# Patient Record
Sex: Male | Born: 1990 | Race: Black or African American | Hispanic: No | Marital: Single | Smoking: Never smoker
Health system: Southern US, Community
[De-identification: ages and names within clinical notes are randomized; demographics above are authoritative.]

## PROBLEM LIST (undated history)

## (undated) DIAGNOSIS — R569 Unspecified convulsions: Secondary | ICD-10-CM

## (undated) HISTORY — PX: BRAIN SURGERY: SHX531

---

## 2017-09-28 ENCOUNTER — Encounter: Payer: BLUE CROSS/BLUE SHIELD | Attending: Physician Assistant | Admitting: Physician Assistant

## 2017-09-28 ENCOUNTER — Other Ambulatory Visit: Payer: Self-pay | Admitting: Neurology

## 2017-09-28 DIAGNOSIS — G40909 Epilepsy, unspecified, not intractable, without status epilepticus: Secondary | ICD-10-CM | POA: Diagnosis not present

## 2017-09-28 DIAGNOSIS — Z87898 Personal history of other specified conditions: Secondary | ICD-10-CM

## 2017-09-28 DIAGNOSIS — Y838 Other surgical procedures as the cause of abnormal reaction of the patient, or of later complication, without mention of misadventure at the time of the procedure: Secondary | ICD-10-CM | POA: Insufficient documentation

## 2017-09-28 DIAGNOSIS — T8131XA Disruption of external operation (surgical) wound, not elsewhere classified, initial encounter: Secondary | ICD-10-CM | POA: Insufficient documentation

## 2017-10-02 NOTE — Progress Notes (Signed)
[name] ([id number]) Visit Report for 09/28/2017 Abuse/Suicide Risk Screen Details Patient Name: [name], [name] Date of Service: 09/28/2017 12:30 PM Medical Record Number: [id number] Patient Account Number: [id number] Date of Birth/Sex: Dec 01, 1990 (26 y.o. M) Treating RN: [name] Primary Care [name]: PATIENT, NO Other Clinician: Referring [name]: [name] Treating [name]/Extender: [name] Weeks in Treatment: 0 Abuse/Suicide Risk Screen Items Answer ABUSE/SUICIDE RISK SCREEN: Has anyone close to you tried to hurt or harm you recentlyo No Do you feel uncomfortable with anyone in your familyo No Has anyone forced you do things that you didnot want to doo No Do you have any thoughts of harming yourselfo No Patient displays signs or symptoms of abuse and/or neglect. No Electronic Signature(s) Signed: 09/28/2017 3:14:51 PM By: [name] Entered By: [name] on 09/28/2017 12:51:23 [name], [name] ([id number]) -------------------------------------------------------------------------------- Activities of Daily Living Details Patient Name: [name] Date of Service: 09/28/2017 12:30 PM Medical Record Number: [id number] Patient Account Number: [id number] Date of Birth/Sex: [birth year] (26 y.o. M) Treating RN: [name] Primary Care [name]: PATIENT, NO Other Clinician: Referring [name]: [name] Treating [name]/Extender: [name] Weeks in Treatment: 0 Activities of Daily Living Items Answer Activities of Daily Living (Please select one for each item) Drive Automobile Not Able Take Medications Completely Able Use Telephone Completely Able Care for Appearance Completely Able Use Toilet Completely Able Bath / Shower Completely Able Dress Self Completely Able Feed Self Completely Able Walk Completely Able Get In / Out Bed Completely Able Housework Completely Able Prepare Meals Completely Able Handle Money Completely  Able Shop for Self Completely Able Electronic Signature(s) Signed: 09/28/2017 3:14:51 PM By: [name] Entered By: [name] on 09/28/2017 12:52:18 [name] ([id number]) -------------------------------------------------------------------------------- Education Assessment Details Patient Name: [name] Date of Service: 09/28/2017 12:30 PM Medical Record Number: [id number] Patient Account Number: [id number] Date of Birth/Sex: 1990/12/19 (26 y.o. M) Treating RN: [name] Primary Care [name]: PATIENT, NO Other Clinician: Referring [name]: [name] Treating [name] Weeks in Treatment: 0 Primary Learner Assessed: Patient Learning Preferences/Education Level/Primary Language Learning Preference: Explanation, Demonstration Highest Education Level: College or Above Preferred Language: English Cognitive Barrier Assessment/Beliefs Language Barrier: No Translator Needed: No Memory Deficit: No Emotional Barrier: No Cultural/Religious Beliefs Affecting Medical Care: No Physical Barrier Assessment Impaired Vision: No Impaired Hearing: No Decreased Hand dexterity: No Knowledge/Comprehension Assessment Knowledge Level: Medium Comprehension Level: Medium Ability to understand written Medium instructions: Ability to understand verbal Medium instructions: Motivation Assessment Anxiety Level: Calm Cooperation: Cooperative Education Importance: Acknowledges Need Interest in Health Problems: Asks Questions Perception: Coherent Willingness to Engage in Self- Medium Management Activities: Readiness to Engage in Self- Medium Management Activities: Electronic Signature(s) Signed: 09/28/2017 3:14:51 PM By: [name] Entered By: [name] on 09/28/2017 12:52:39 [name], [name] ([id number]) -------------------------------------------------------------------------------- Fall Risk Assessment Details Patient Name: [name] Date of Service: 09/28/2017 12:30 PM Medical Record Number: [id number] Patient Account Number: [id number] Date of Birth/Sex: [birth year] (26 y.o. M) Treating RN: [name] Primary Care Khushboo Chuck: PATIENT, NO Other Clinician: Referring Anyela Napierkowski: [name] Treating [name]/Extender: [name] Weeks in Treatment: 0 Fall Risk Assessment Items Have you had 2 or more falls in the last 12 monthso 0 No Have you had any fall that resulted in injury in the last 12 monthso 0 No FALL RISK ASSESSMENT: History of falling - immediate or within 3 months 0 No Secondary diagnosis 0 No Ambulatory aid None/bed rest/wheelchair/nurse 0 Yes Crutches/cane/walker 0 No Furniture 0 No IV Access/Saline Lock 0 No Gait/Training  Normal/bed rest/immobile 0 Yes Weak 0 No Impaired 0 No Mental Status Oriented to own ability 0 Yes Electronic Signature(s) Signed: 09/28/2017 3:14:51 PM By: Howard Sitesorthy, [name] Entered By: Howard Sitesorthy, [name] on 09/28/2017 12:52:47 [name], [name] ([id number]) -------------------------------------------------------------------------------- Nutrition Risk Assessment Details Patient Name: [name], [name] Date of Service: 09/28/2017 12:30 PM Medical Record Number: [id number] Patient Account Number: [id number] Date of Birth/Sex: 11/24/1990 (26 y.o. M) Treating RN: Howard Sitesorthy, [name] Primary Care Dimetrius Montfort: PATIENT, NO Other Clinician: Referring Latravion Graves: Howard MasterPOTTER, [name] Treating Mitchelle Sultan/Extender: [name] Weeks in Treatment: 0 Height (in): 69 Weight (lbs): 160 Body Mass Index (BMI): 23.6 Nutrition Risk Assessment Items NUTRITION RISK SCREEN: I have an illness or condition that made me change the kind and/or amount of 0 No food I eat I eat fewer than two meals per day 0 No I eat few fruits and vegetables, or milk products 0 No I have three or more drinks of beer, liquor or wine almost every day 0 No I have tooth or mouth problems that make it hard for me to eat 0 No I  don't always have enough money to buy the food I need 0 No I eat alone most of the time 0 No I take three or more different prescribed or over-the-counter drugs a day 0 No Without wanting to, I have lost or gained 10 pounds in the last six months 0 No I am not always physically able to shop, cook and/or feed myself 0 No Nutrition Protocols Good Risk Protocol 0 No interventions needed Moderate Risk Protocol Electronic Signature(s) Signed: 09/28/2017 3:14:51 PM By: Howard Sitesorthy, [name] Entered By: Howard Sitesorthy, [name] on 09/28/2017 12:52:53

## 2017-10-04 ENCOUNTER — Ambulatory Visit: Payer: BLUE CROSS/BLUE SHIELD

## 2017-10-04 NOTE — Progress Notes (Signed)
Howard, Delgado ([id number]) Visit Report for 09/28/2017 Allergy List Details Patient Name: [name], [name] Date of Service: 09/28/2017 12:30 PM Medical Record Number: [id number] Patient Account Number: [id number] Date of Birth/Sex: 05-17-1991 (26 y.o. M) Treating RN: Curtis Sites Primary Care Sinan Tuch: PATIENT, NO Other Clinician: Referring Trishna Cwik: Theora Master Treating Vilas Edgerly/Extender: STONE III, HOYT Weeks in Treatment: 0 Allergies Active Allergies No Known Allergies Allergy Notes Electronic Signature(s) Signed: 09/28/2017 3:14:51 PM By: Curtis Sites Entered By: Curtis Sites on 09/28/2017 12:46:52 [name] ([id number]) -------------------------------------------------------------------------------- Arrival Information Details Patient Name: [name] Date of Service: 09/28/2017 12:30 PM Medical Record Number: [id number] Patient Account Number: [id number] Date of Birth/Sex: 04/03/1991 (26 y.o. M) Treating RN: Curtis Sites Primary Care Reigan Tolliver: PATIENT, NO Other Clinician: Referring Vadim Centola: Theora Master Treating Claudia Alvizo/Extender: Linwood Dibbles, HOYT Weeks in Treatment: 0 Visit Information Patient Arrived: Ambulatory Arrival Time: 12:43 Accompanied By: grandmother Transfer Assistance: None Patient Identification Verified: Yes Secondary Verification Process Completed: Yes Electronic Signature(s) Signed: 09/28/2017 3:14:51 PM By: Curtis Sites Entered By: Curtis Sites on 09/28/2017 12:44:02 [name] ([id number]) -------------------------------------------------------------------------------- Clinic Level of Care Assessment Details Patient Name: [name] Date of Service: 09/28/2017 12:30 PM Medical Record Number: [id number] Patient Account Number: [id number] Date of Birth/Sex: 1991-03-23 (26 y.o. M) Treating RN: Phillis Haggis Primary Care Neema Barreira: PATIENT, NO Other Clinician: Referring Karley Pho: Theora Master Treating Ulla Mckiernan/Extender:  STONE III, HOYT Weeks in Treatment: 0 Clinic Level of Care Assessment Items TOOL 2 Quantity Score X - Use when only an EandM is performed on the INITIAL visit 1 0 ASSESSMENTS - Nursing Assessment / Reassessment X - General Physical Exam (combine w/ comprehensive assessment (listed just below) when 1 20 performed on new pt. evals) X- 1 25 Comprehensive Assessment (HX, ROS, Risk Assessments, Wounds Hx, etc.) ASSESSMENTS - Wound and Skin Assessment / Reassessment X - Simple Wound Assessment / Reassessment - one wound 1 5 []  - 0 Complex Wound Assessment / Reassessment - multiple wounds []  - 0 Dermatologic / Skin Assessment (not related to wound area) ASSESSMENTS - Ostomy and/or Continence Assessment and Care []  - Incontinence Assessment and Management 0 []  - 0 Ostomy Care Assessment and Management (repouching, etc.) PROCESS - Coordination of Care X - Simple Patient / Family Education for ongoing care 1 15 []  - 0 Complex (extensive) Patient / Family Education for ongoing care []  - 0 Staff obtains Chiropractor, Records, Test Results / Process Orders []  - 0 Staff telephones HHA, Nursing Homes / Clarify orders / etc []  - 0 Routine Transfer to another Facility (non-emergent condition) []  - 0 Routine Hospital Admission (non-emergent condition) X- 1 15 New Admissions / Manufacturing engineer / Ordering NPWT, Apligraf, etc. []  - 0 Emergency Hospital Admission (emergent condition) X- 1 10 Simple Discharge Coordination []  - 0 Complex (extensive) Discharge Coordination PROCESS - Special Needs []  - Pediatric / Minor Patient Management 0 []  - 0 Isolation Patient Management [name], [name] ([id number]) []  - 0 Hearing / Language / Visual special needs []  - 0 Assessment of Community assistance (transportation, D/C planning, etc.) []  - 0 Additional assistance / Altered mentation []  - 0 Support Surface(s) Assessment (bed, cushion, seat, etc.) INTERVENTIONS - Wound Cleansing /  Measurement X - Wound Imaging (photographs - any number of wounds) 1 5 []  - 0 Wound Tracing (instead of photographs) X- 1 5 Simple Wound Measurement - one wound []  - 0 Complex Wound Measurement - multiple wounds X- 1 5 Simple Wound Cleansing - one wound []  - 0 Complex Wound Cleansing - multiple wounds INTERVENTIONS -  Wound Dressings X - Small Wound Dressing one or multiple wounds 1 10 []  - 0 Medium Wound Dressing one or multiple wounds []  - 0 Large Wound Dressing one or multiple wounds []  - 0 Application of Medications - injection INTERVENTIONS - Miscellaneous []  - External ear exam 0 []  - 0 Specimen Collection (cultures, biopsies, blood, body fluids, etc.) []  - 0 Specimen(s) / Culture(s) sent or taken to Lab for analysis []  - 0 Patient Transfer (multiple staff / Michiel SitesHoyer Lift / Similar devices) []  - 0 Simple Staple / Suture removal (25 or less) []  - 0 Complex Staple / Suture removal (26 or more) []  - 0 Hypo / Hyperglycemic Management (close monitor of Blood Glucose) []  - 0 Ankle / Brachial Index (ABI) - do not check if billed separately Has the patient been seen at the hospital within the last three years: Yes Total Score: 115 Level Of Care: New/Established - Level 3 Electronic Signature(s) Signed: 09/28/2017 3:21:08 PM By: Alejandro MullingPinkerton, Debra Entered By: Alejandro MullingPinkerton, Debra on 09/28/2017 15:16:02 [name], [name] ([id number]) -------------------------------------------------------------------------------- Encounter Discharge Information Details Patient Name: [name], [name] Date of Service: 09/28/2017 12:30 PM Medical Record Number: [id number] Patient Account Number: [id number] Date of Birth/Sex: 1991/01/11 (26 y.o. M) Treating RN: Phillis HaggisPinkerton, Debi Primary Care Montserrat Shek: PATIENT, NO Other Clinician: Referring Janasha Barkalow: Theora MasterPOTTER, ZACHARY Treating Haruye Lainez/Extender: Linwood DibblesSTONE III, HOYT Weeks in Treatment: 0 Encounter Discharge Information Items Discharge Pain Level: 0 Discharge  Condition: Stable Ambulatory Status: Ambulatory Discharge Destination: Home Transportation: Private Auto Accompanied By: grandmother Schedule Follow-up Appointment: Yes Medication Reconciliation completed and No provided to Patient/Care Asim Gersten: Provided on Clinical Summary of Care: 09/28/2017 Form Type Recipient Paper Patient AB Electronic Signature(s) Signed: 09/29/2017 4:34:44 PM By: Renne CriglerFlinchum, Cheryl Entered By: Renne CriglerFlinchum, Cheryl on 09/28/2017 13:22:41 [name], [name] ([id number]) -------------------------------------------------------------------------------- Multi Wound Chart Details Patient Name: [name], [name] Date of Service: 09/28/2017 12:30 PM Medical Record Number: [id number] Patient Account Number: [id number] Date of Birth/Sex: 1991/01/11 (26 y.o. M) Treating RN: Phillis HaggisPinkerton, Debi Primary Care Jaspal Pultz: PATIENT, NO Other Clinician: Referring Takari Lundahl: Theora MasterPOTTER, ZACHARY Treating Lajarvis Italiano/Extender: STONE III, HOYT Weeks in Treatment: 0 Vital Signs Height(in): 69 Pulse(bpm): 68 Weight(lbs): 160 Blood Pressure(mmHg): 112/75 Body Mass Index(BMI): 24 Temperature(F): 98.3 Respiratory Rate 18 (breaths/min): Photos: [1:No Photos] [N/A:N/A] Wound Location: [1:Left Forehead] [N/A:N/A] Wounding Event: [1:Surgical Injury] [N/A:N/A] Primary Etiology: [1:Open Surgical Wound] [N/A:N/A] Comorbid History: [1:Seizure Disorder] [N/A:N/A] Date Acquired: [1:08/03/2017] [N/A:N/A] Weeks of Treatment: [1:0] [N/A:N/A] Wound Status: [1:Open] [N/A:N/A] Measurements L x W x D [1:0.2x0.2x0.1] [N/A:N/A] (cm) Area (cm) : [1:0.031] [N/A:N/A] Volume (cm) : [1:0.003] [N/A:N/A] Classification: [1:Full Thickness Without Exposed Support Structures] [N/A:N/A] Exudate Amount: [1:Medium] [N/A:N/A] Exudate Type: [1:Serous] [N/A:N/A] Exudate Color: [1:amber] [N/A:N/A] Wound Margin: [1:Flat and Intact] [N/A:N/A] Granulation Amount: [1:Medium (34-66%)] [N/A:N/A] Granulation Quality: [1:Red]  [N/A:N/A] Necrotic Amount: [1:Medium (34-66%)] [N/A:N/A] Exposed Structures: [1:Fascia: No Fat Layer (Subcutaneous Tissue) Exposed: No Tendon: No Muscle: No Joint: No Bone: No] [N/A:N/A] Epithelialization: [1:None] [N/A:N/A] Periwound Skin Texture: [1:Scarring: Yes Excoriation: No Induration: No Callus: No Crepitus: No Rash: No] [N/A:N/A] Periwound Skin Moisture: [1:Maceration: No Dry/Scaly: No] [N/A:N/A] Periwound Skin Color: Atrophie Blanche: No N/A N/A Cyanosis: No Ecchymosis: No Erythema: No Hemosiderin Staining: No Mottled: No Pallor: No Rubor: No Temperature: No Abnormality N/A N/A Tenderness on Palpation: No N/A N/A Wound Preparation: Ulcer Cleansing: N/A N/A Rinsed/Irrigated with Saline Topical Anesthetic Applied: None Treatment Notes Electronic Signature(s) Signed: 09/28/2017 3:21:08 PM By: Alejandro MullingPinkerton, Debra Entered By: Alejandro MullingPinkerton, Debra on 09/28/2017 13:07:20 [name], Mccade ([id number]) -------------------------------------------------------------------------------- Multi-Disciplinary Care Plan Details Patient Name: [name], [name] Date of  Service: 09/28/2017 12:30 PM Medical Record Number: [id number] Patient Account Number: [id number] Date of Birth/Sex: 1991-05-28 (26 y.o. M) Treating RN: Phillis Haggis Primary Care Martrice Apt: PATIENT, NO Other Clinician: Referring Keshawn Sundberg: Theora Master Treating Elion Hocker/Extender: Linwood Dibbles, HOYT Weeks in Treatment: 0 Active Inactive ` Orientation to the Wound Care Program Nursing Diagnoses: Knowledge deficit related to the wound healing center program Goals: Patient/caregiver will verbalize understanding of the Wound Healing Center Program Date Initiated: 09/28/2017 Target Resolution Date: 10/23/2017 Goal Status: Active Interventions: Provide education on orientation to the wound center Notes: ` Wound/Skin Impairment Nursing Diagnoses: Impaired tissue integrity Knowledge deficit related to ulceration/compromised skin  integrity Goals: Ulcer/skin breakdown will have a volume reduction of 80% by week 12 Date Initiated: 09/28/2017 Target Resolution Date: 01/15/2018 Goal Status: Active Interventions: Assess patient/caregiver ability to perform ulcer/skin care regimen upon admission and as needed Assess ulceration(s) every visit Notes: Electronic Signature(s) Signed: 09/28/2017 3:21:08 PM By: Alejandro Mulling Entered By: Alejandro Mulling on 09/28/2017 13:06:03 [name] ([id number]) -------------------------------------------------------------------------------- Pain Assessment Details Patient Name: [name] Date of Service: 09/28/2017 12:30 PM Medical Record Number: [id number] Patient Account Number: [id number] Date of Birth/Sex: 09/24/1990 (26 y.o. M) Treating RN: Curtis Sites Primary Care Michele Judy: PATIENT, NO Other Clinician: Referring Aundra Espin: Theora Master Treating Imraan Wendell/Extender: Linwood Dibbles, HOYT Weeks in Treatment: 0 Active Problems Location of Pain Severity and Description of Pain Patient Has Paino No Site Locations Pain Management and Medication Current Pain Management: Electronic Signature(s) Signed: 09/28/2017 3:14:51 PM By: Curtis Sites Entered By: Curtis Sites on 09/28/2017 12:44:15 [name] ([id number]) -------------------------------------------------------------------------------- Patient/Caregiver Education Details Patient Name: [name] Date of Service: 09/28/2017 12:30 PM Medical Record Number: [id number] Patient Account Number: [id number] Date of Birth/Gender: 28-Mar-1991 (26 y.o. M) Treating RN: Renne Crigler Primary Care Physician: PATIENT, NO Other Clinician: Referring Physician: Theora Master Treating Physician/Extender: Skeet Simmer in Treatment: 0 Education Assessment Education Provided To: Patient Education Topics Provided Welcome To The W[facility] Center: Handouts: Welcome To The [facility] Methods:  Explain/Verbal Responses: State content correctly Wound/Skin Impairment: Handouts: Caring for Your Ulcer, Skin Care Do's and Dont's Methods: Explain/Verbal Responses: State content correctly Electronic Signature(s) Signed: 09/29/2017 4:34:44 PM By: Renne Crigler Entered By: Renne Crigler on 09/28/2017 13:23:03 [name] ([id number]) -------------------------------------------------------------------------------- Wound Assessment Details Patient Name: [name] Date of Service: 09/28/2017 12:30 PM Medical Record Number: [id number] Patient Account Number: [id number] Date of Birth/Sex: 25-Mar-1991 (26 y.o. M) Treating RN: Phillis Haggis Primary Care Rayvin Abid: PATIENT, NO Other Clinician: Referring Analysse Quinonez: Theora Master Treating Jenness Stemler/Extender: STONE III, HOYT Weeks in Treatment: 0 Wound Status Wound Number: 1 Primary Etiology: Open Surgical Wound Wound Location: Left Forehead Wound Status: Open Wounding Event: Surgical Injury Comorbid History: Seizure Disorder Date Acquired: 08/03/2017 Weeks Of Treatment: 0 Clustered Wound: No Wound Measurements Length: (cm) 0.2 % Reduc Width: (cm) 0.2 % Reduc Depth: (cm) 0.3 Epithel Area: (cm) 0.031 Tunnel Volume: (cm) 0.009 Underm Star Endi Maxi tion in Area: 0% tion in Volume: 0% ialization: None ing: No ining: Yes ting Position (o'clock): 12 ng Position (o'clock): 12 mum Distance: (cm) 1.7 Wound Description Full Thickness Without Exposed Support Foul Od Classification: Structures Slough/ Wound Margin: Flat and Intact Exudate Medium Amount: Exudate Type: Serous Exudate Color: amber or After Cleansing: No Fibrino Yes Wound Bed Granulation Amount: Medium (34-66%) Exposed Structure Granulation Quality: Red Fascia Exposed: No Necrotic Amount: Medium (34-66%) Fat Layer (Subcutaneous Tissue) Exposed: No Necrotic Quality: Adherent Slough Tendon Exposed: No Muscle Exposed: No Joint Exposed: No Bone  Exposed: No Periwound  Skin Texture Texture Color No Abnormalities Noted: No No Abnormalities Noted: No Callus: No Atrophie Blanche: No Crepitus: No Cyanosis: No Excoriation: No Ecchymosis: No Induration: No Erythema: No Rash: No Hemosiderin Staining: No [name], [name] ([id number]) Scarring: Yes Mottled: No Pallor: No Moisture Rubor: No No Abnormalities Noted: No Dry / Scaly: No Temperature / Pain Maceration: No Temperature: No Abnormality Wound Preparation Ulcer Cleansing: Rinsed/Irrigated with Saline Topical Anesthetic Applied: None Treatment Notes Wound #1 (Left Forehead) 1. Cleansed with: Clean wound with Normal Saline 2. Anesthetic Topical Lidocaine 4% cream to wound bed prior to debridement 4. Dressing Applied: Iodoform packing Gauze 5. Secondary Dressing Applied Dry Gauze Notes coverlet Electronic Signature(s) Signed: 09/28/2017 3:21:08 PM By: Alejandro Mulling Entered By: Alejandro Mulling on 09/28/2017 13:10:34 [name], [name] ([id number]) -------------------------------------------------------------------------------- Vitals Details Patient Name: [name] Date of Service: 09/28/2017 12:30 PM Medical Record Number: [id number] Patient Account Number: [id number] Date of Birth/Sex: 08-20-1990 (26 y.o. M) Treating RN: Curtis Sites Primary Care Annelise Mccoy: PATIENT, NO Other Clinician: Referring Tarius Stangelo: Theora Master Treating Ronn Smolinsky/Extender: STONE III, HOYT Weeks in Treatment: 0 Vital Signs Time Taken: 12:44 Temperature (F): 98.3 Height (in): 69 Pulse (bpm): 68 Source: Measured Respiratory Rate (breaths/min): 18 Weight (lbs): 160 Blood Pressure (mmHg): 112/75 Source: Measured Reference Range: 80 - 120 mg / dl Body Mass Index (BMI): 23.6 Electronic Signature(s) Signed: 09/28/2017 3:14:51 PM By: Curtis Sites Entered By: Curtis Sites on 09/28/2017 12:46:27
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
## 2017-10-05 ENCOUNTER — Encounter: Payer: BLUE CROSS/BLUE SHIELD | Admitting: Physician Assistant

## 2017-10-05 DIAGNOSIS — T8131XA Disruption of external operation (surgical) wound, not elsewhere classified, initial encounter: Secondary | ICD-10-CM | POA: Diagnosis not present

## 2017-10-06 ENCOUNTER — Ambulatory Visit
Admission: RE | Admit: 2017-10-06 | Discharge: 2017-10-06 | Disposition: A | Discharge status: Home / Self Care | Payer: BLUE CROSS/BLUE SHIELD | Source: Ambulatory Visit | Attending: Physician Assistant | Admitting: Physician Assistant

## 2017-10-06 ENCOUNTER — Other Ambulatory Visit: Payer: Self-pay | Admitting: Physician Assistant

## 2017-10-06 DIAGNOSIS — T148XXA Other injury of unspecified body region, initial encounter: Secondary | ICD-10-CM

## 2017-10-06 DIAGNOSIS — Z9889 Other specified postprocedural states: Secondary | ICD-10-CM | POA: Diagnosis not present

## 2017-10-06 DIAGNOSIS — X58XXXA Exposure to other specified factors, initial encounter: Secondary | ICD-10-CM | POA: Insufficient documentation

## 2017-10-06 DIAGNOSIS — T8189XA Other complications of procedures, not elsewhere classified, initial encounter: Secondary | ICD-10-CM | POA: Diagnosis present

## 2017-10-06 IMAGING — CR DG SKULL 1-3V
1 series · 2 of 2 positions shown · non-contrast
Comparison: None.

CLINICAL DATA: 26-year-old male with a nonhealing wound of the left
forehead

EXAM:
SKULL - 1-3 VIEW

[Series 1: dg skull 1-3 views · 0.14mm/px · 2 of 2 slices shown]
[im 1/2]
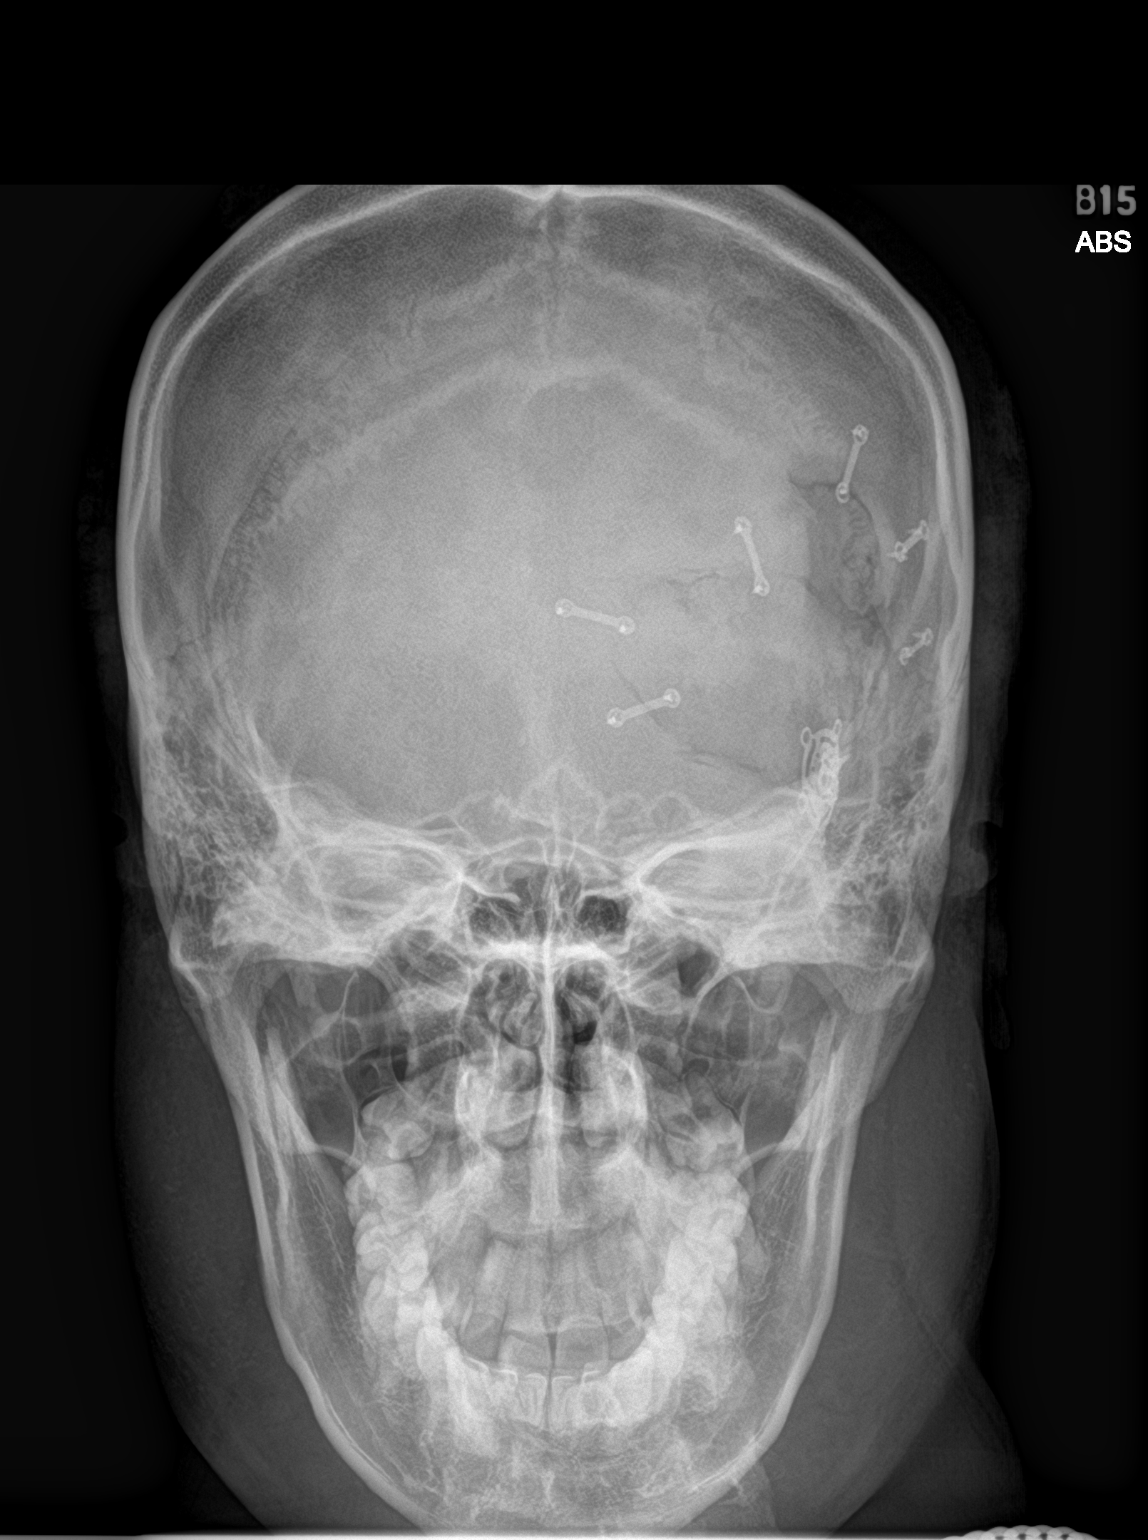
[im 2/2]
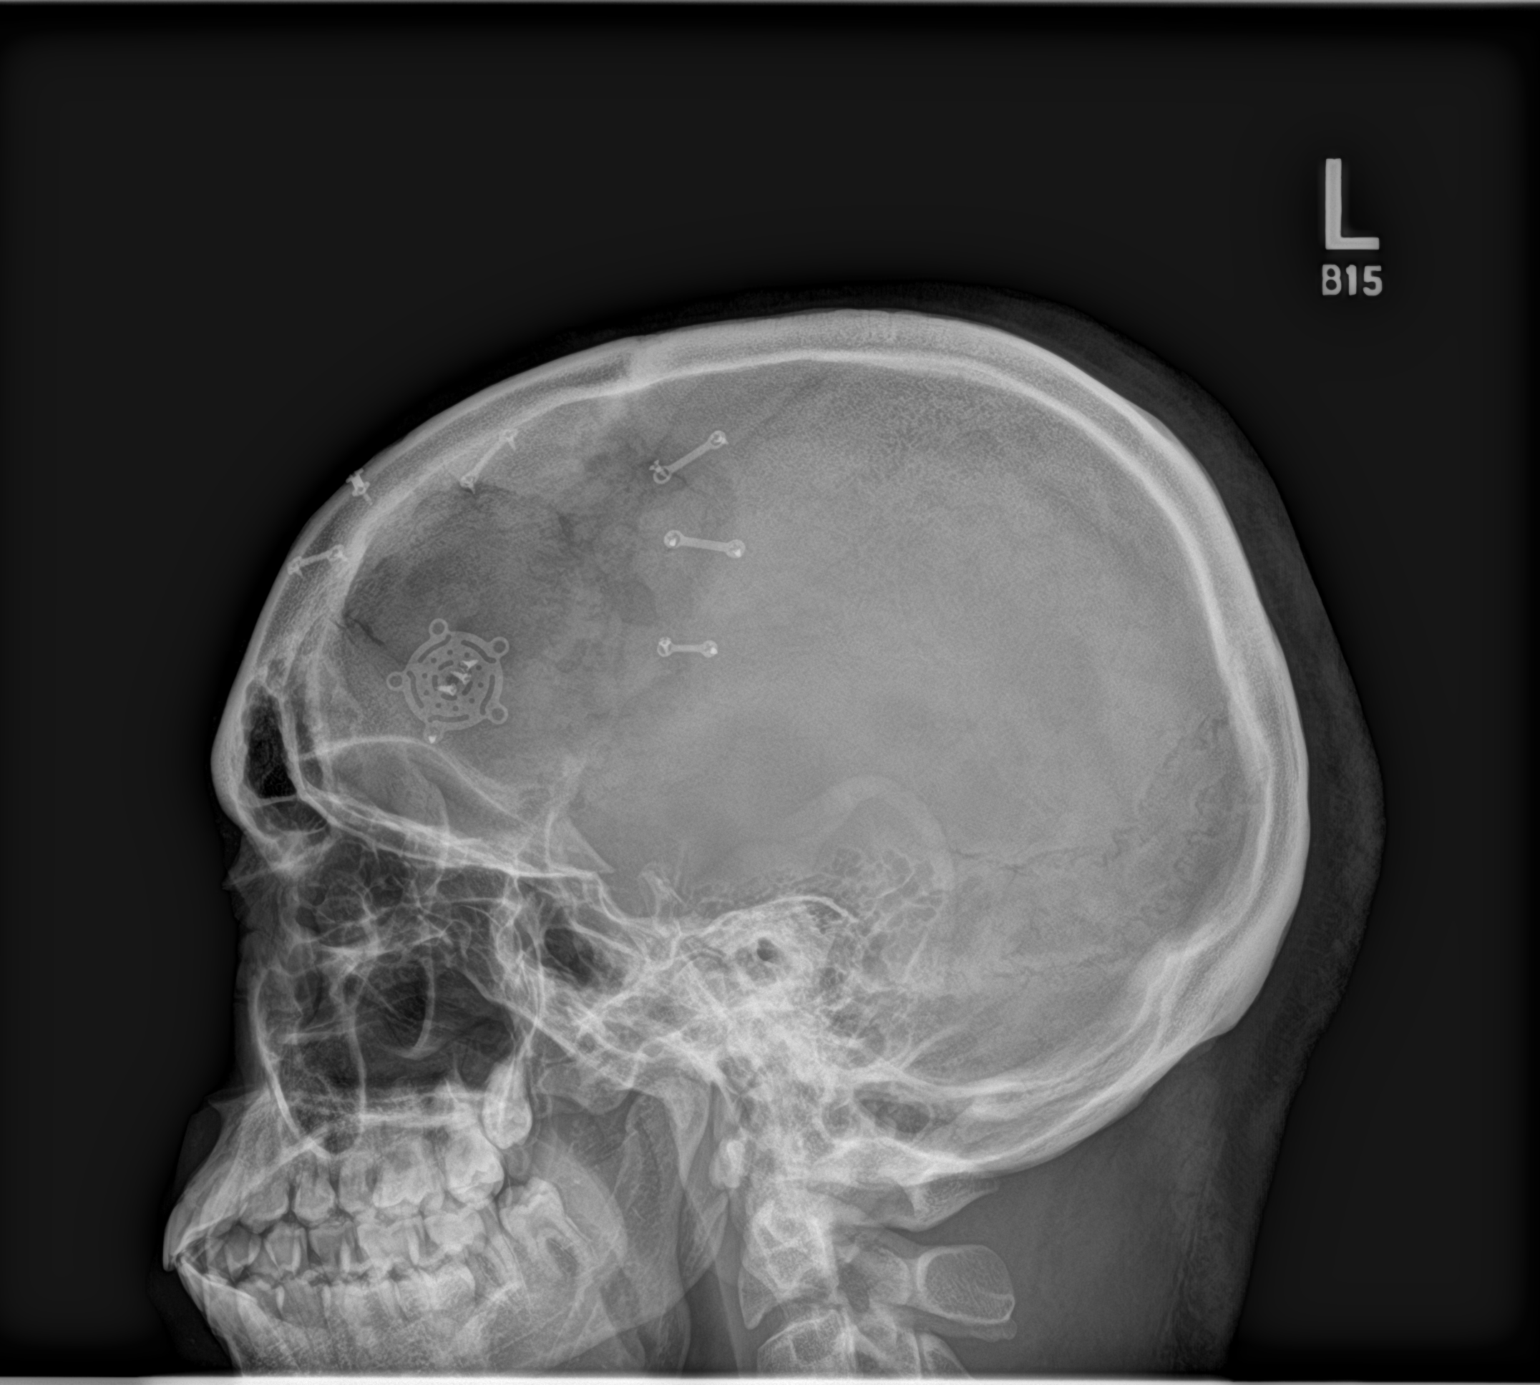

[2 of 2 positions shown; findings below may reference images not displayed]

FINDINGS: Surgical changes of prior left frontotemporal craniotomy with
complex malleable plate and screw fixation. In the region of the
lateral left supraorbital region there is a disc like plate with 3
overlying screws. The screws do not appear to be within the
calvarium. Additionally, there is a small fragment of screw
overlying 1 of the plates. The additional 6 plates and 12 screws
appear to be intact.
IMPRESSION: Surgical changes of prior left frontotemporal craniotomy with
evidence of at least 3 loose screws and 1 screw fragment overlying
the disc like malleable plate at the lateral supra orbital
calvarium.

## 2017-10-12 ENCOUNTER — Ambulatory Visit: Payer: BLUE CROSS/BLUE SHIELD | Admitting: Physician Assistant
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
## 2017-10-12 NOTE — Progress Notes (Signed)
QUINTO, TIPPY ([id number]) Visit Report for 10/05/2017 Arrival Information Details Patient Name: [name], [name] Date of Service: 10/05/2017 3:00 PM Medical Record Number: [id number] Patient Account Number: [id number] Date of Birth/Sex: Aug 13, 1990 (26 y.o. M) Treating RN: Curtis Sites Primary Care Malikah Lakey: PATIENT, NO Other Clinician: Referring Thaddus Mcdowell: Theora Master Treating Zed Wanninger/Extender: Linwood Dibbles, HOYT Weeks in Treatment: 1 Visit Information History Since Last Visit Added or deleted any medications: No Patient Arrived: Ambulatory Any new allergies or adverse reactions: No Arrival Time: 15:26 Had a fall or experienced change in No Accompanied By: grandmother activities of daily living that may affect Transfer Assistance: None risk of falls: Patient Identification Verified: Yes Signs or symptoms of abuse/neglect since last visito No Secondary Verification Process Completed: Yes Hospitalized since last visit: No Implantable device outside of the clinic excluding No cellular tissue based products placed in the center since last visit: Has Dressing in Place as Prescribed: Yes Pain Present Now: No Electronic Signature(s) Signed: 10/05/2017 4:18:46 PM By: Curtis Sites Entered By: Curtis Sites on 10/05/2017 15:26:29 [name] ([id number]) -------------------------------------------------------------------------------- Clinic Level of Care Assessment Details Patient Name: [name] Date of Service: 10/05/2017 3:00 PM Medical Record Number: [id number] Patient Account Number: [id number] Date of Birth/Sex: 29-May-1991 (26 y.o. M) Treating RN: Phillis Haggis Primary Care Jewelene Mairena: PATIENT, NO Other Clinician: Referring Kaesen Rodriguez: Theora Master Treating Cannie Muckle/Extender: Linwood Dibbles, HOYT Weeks in Treatment: 1 Clinic Level of Care Assessment Items TOOL 4 Quantity Score X - Use when only an EandM is performed on FOLLOW-UP visit 1 0 ASSESSMENTS - Nursing Assessment  / Reassessment X - Reassessment of Co-morbidities (includes updates in patient status) 1 10 X- 1 5 Reassessment of Adherence to Treatment Plan ASSESSMENTS - Wound and Skin Assessment / Reassessment X - Simple Wound Assessment / Reassessment - one wound 1 5  - 0 Complex Wound Assessment / Reassessment - multiple wounds  - 0 Dermatologic / Skin Assessment (not related to wound area) ASSESSMENTS - Focused Assessment  - Circumferential Edema Measurements - multi extremities 0  - 0 Nutritional Assessment / Counseling / Intervention  - 0 Lower Extremity Assessment (monofilament, tuning fork, pulses)  - 0 Peripheral Arterial Disease Assessment (using hand held doppler) ASSESSMENTS - Ostomy and/or Continence Assessment and Care  - Incontinence Assessment and Management 0  - 0 Ostomy Care Assessment and Management (repouching, etc.) PROCESS - Coordination of Care X - Simple Patient / Family Education for ongoing care 1 15  - 0 Complex (extensive) Patient / Family Education for ongoing care  - 0 Staff obtains Chiropractor, Records, Test Results / Process Orders  - 0 Staff telephones HHA, Nursing Homes / Clarify orders / etc  - 0 Routine Transfer to another Facility (non-emergent condition)  - 0 Routine Hospital Admission (non-emergent condition)  - 0 New Admissions / Manufacturing engineer / Ordering NPWT, Apligraf, etc.  - 0 Emergency Hospital Admission (emergent condition) X- 1 10 Simple Discharge Coordination TAUHEED, MCFAYDEN ([id number])  - 0 Complex (extensive) Discharge Coordination PROCESS - Special Needs  - Pediatric / Minor Patient Management 0  - 0 Isolation Patient Management  - 0 Hearing / Language / Visual special needs  - 0 Assessment of Community assistance (transportation, D/C planning, etc.)  - 0 Additional assistance / Altered mentation  - 0 Support Surface(s) Assessment (bed, cushion, seat, etc.) INTERVENTIONS -  Wound Cleansing / Measurement X - Simple Wound Cleansing - one wound 1 5  - 0 Complex Wound Cleansing - multiple wounds X- 1 5 Wound Imaging (photographs - any number  of wounds)  - 0 Wound Tracing (instead of photographs) X- 1 5 Simple Wound Measurement - one wound  - 0 Complex Wound Measurement - multiple wounds INTERVENTIONS - Wound Dressings X - Small Wound Dressing one or multiple wounds 1 10  - 0 Medium Wound Dressing one or multiple wounds  - 0 Large Wound Dressing one or multiple wounds X- 1 5 Application of Medications - topical  - 0 Application of Medications - injection INTERVENTIONS - Miscellaneous  - External ear exam 0  - 0 Specimen Collection (cultures, biopsies, blood, body fluids, etc.)  - 0 Specimen(s) / Culture(s) sent or taken to Lab for analysis  - 0 Patient Transfer (multiple staff / Nurse, adult / Similar devices)  - 0 Simple Staple / Suture removal (25 or less)  - 0 Complex Staple / Suture removal (26 or more)  - 0 Hypo / Hyperglycemic Management (close monitor of Blood Glucose)  - 0 Ankle / Brachial Index (ABI) - do not check if billed separately X- 1 5 Vital Signs Nobel, Hershal ([id number]) Has the patient been seen at the hospital within the last three years: Yes Total Score: 80 Level Of Care: New/Established - Level 3 Electronic Signature(s) Signed: 10/08/2017 4:29:16 PM By: Alejandro Mulling Entered By: Alejandro Mulling on 10/05/2017 17:15:56 [name] ([id number]) -------------------------------------------------------------------------------- Encounter Discharge Information Details Patient Name: [name] Date of Service: 10/05/2017 3:00 PM Medical Record Number: [id number] Patient Account Number: [id number] Date of Birth/Sex: 02/09/1991 (26 y.o. M) Treating RN: Phillis Haggis Primary Care Dessie Delcarlo: PATIENT, NO Other Clinician: Referring Miguelangel Korn: Theora Master Treating Kashtyn Jankowski/Extender: Linwood Dibbles, HOYT Weeks in Treatment: 1 Encounter Discharge Information Items Discharge Pain Level: 0 Discharge Condition: Stable Ambulatory Status: Ambulatory Discharge Destination: Home Transportation: Private Auto Accompanied By: grandmother Schedule Follow-up Appointment: Yes Medication Reconciliation completed and No provided to Patient/Care Hannah Crill: Provided on Clinical Summary of Care: 10/05/2017 Form Type Recipient Paper Patient AB Electronic Signature(s) Signed: 10/05/2017 4:51:25 PM By: Renne Crigler Entered By: Renne Crigler on 10/05/2017 16:07:40 [name] ([id number]) -------------------------------------------------------------------------------- Lower Extremity Assessment Details Patient Name: [name] Date of Service: 10/05/2017 3:00 PM Medical Record Number: [id number] Patient Account Number: [id number] Date of Birth/Sex: 1991/03/09 (26 y.o. M) Treating RN: Curtis Sites Primary Care Teri Legacy: PATIENT, NO Other Clinician: Referring Ronae Noell: Theora Master Treating Doralyn Kirkes/Extender: Linwood Dibbles, HOYT Weeks in Treatment: 1 Electronic Signature(s) Signed: 10/05/2017 4:18:46 PM By: Curtis Sites Entered By: Curtis Sites on 10/05/2017 15:34:34 DVANTE, HANDS ([id number]) -------------------------------------------------------------------------------- Multi Wound Chart Details Patient Name: [name] Date of Service: 10/05/2017 3:00 PM Medical Record Number: [id number] Patient Account Number: [id number] Date of Birth/Sex: 09/02/1990 (26 y.o. M) Treating RN: Phillis Haggis Primary Care Shron Ozer: PATIENT, NO Other Clinician: Referring Junice Fei: Theora Master Treating Perseus Westall/Extender: STONE III, HOYT Weeks in Treatment: 1 Vital Signs Height(in): 69 Pulse(bpm): 74 Weight(lbs): 160 Blood Pressure(mmHg): 114/67 Body Mass Index(BMI): 24 Temperature(F): 98.3 Respiratory Rate 18 (breaths/min): Photos: [1:No Photos] [N/A:N/A] Wound Location:  [1:Left Forehead] [N/A:N/A] Wounding Event: [1:Surgical Injury] [N/A:N/A] Primary Etiology: [1:Open Surgical Wound] [N/A:N/A] Comorbid History: [1:Seizure Disorder] [N/A:N/A] Date Acquired: [1:08/03/2017] [N/A:N/A] Weeks of Treatment: [1:1] [N/A:N/A] Wound Status: [1:Open] [N/A:N/A] Measurements L x W x D [1:0.3x0.3x0.2] [N/A:N/A] (cm) Area (cm) : [1:0.071] [N/A:N/A] Volume (cm) : [1:0.014] [N/A:N/A] % Reduction in Area: [1:-129.00%] [N/A:N/A] % Reduction in Volume: [1:-55.60%] [N/A:N/A] Position 1 (o'clock): [1:11] Maximum Distance 1 (cm): [1:1.5] Tunneling: [1:Yes] [N/A:N/A] Classification: [1:Full Thickness Without Exposed Support Structures] [N/A:N/A] Exudate Amount: [1:Medium] [N/A:N/A] Exudate Type: [1:Serous] [N/A:N/A] Exudate Color: [1:amber] [N/A:N/A] Wound Margin: [  1:Flat and Intact] [N/A:N/A] Granulation Amount: [1:Large (67-100%)] [N/A:N/A] Granulation Quality: [1:Red] [N/A:N/A] Necrotic Amount: [1:None Present (0%)] [N/A:N/A] Exposed Structures: [1:Fascia: No Fat Layer (Subcutaneous Tissue) Exposed: No Tendon: No Muscle: No Joint: No Bone: No] [N/A:N/A] Epithelialization: [1:None] [N/A:N/A] Periwound Skin Texture: [1:Scarring: Yes Excoriation: No Induration: No] [N/A:N/A] Callus: No Crepitus: No Rash: No Periwound Skin Moisture: Maceration: No N/A N/A Dry/Scaly: No Periwound Skin Color: Atrophie Blanche: No N/A N/A Cyanosis: No Ecchymosis: No Erythema: No Hemosiderin Staining: No Mottled: No Pallor: No Rubor: No Temperature: No Abnormality N/A N/A Tenderness on Palpation: No N/A N/A Wound Preparation: Ulcer Cleansing: N/A N/A Rinsed/Irrigated with Saline Topical Anesthetic Applied: Other: lidocaine 4% Treatment Notes Electronic Signature(s) Signed: 10/08/2017 4:29:16 PM By: Alejandro Mulling Entered By: Alejandro Mulling on 10/05/2017 15:51:09 KLYDE, BANKA  ([id number]) -------------------------------------------------------------------------------- Multi-Disciplinary Care Plan Details Patient Name: [name] Date of Service: 10/05/2017 3:00 PM Medical Record Number: [id number] Patient Account Number: [id number] Date of Birth/Sex: May 31, 1991 (26 y.o. M) Treating RN: Phillis Haggis Primary Care Zayana Salvador: PATIENT, NO Other Clinician: Referring Jeryl Wilbourn: Theora Master Treating Darreld Hoffer/Extender: Linwood Dibbles, HOYT Weeks in Treatment: 1 Active Inactive ` Orientation to the Wound Care Program Nursing Diagnoses: Knowledge deficit related to the wound healing center program Goals: Patient/caregiver will verbalize understanding of the Wound Healing Center Program Date Initiated: 09/28/2017 Target Resolution Date: 10/23/2017 Goal Status: Active Interventions: Provide education on orientation to the wound center Notes: ` Wound/Skin Impairment Nursing Diagnoses: Impaired tissue integrity Knowledge deficit related to ulceration/compromised skin integrity Goals: Ulcer/skin breakdown will have a volume reduction of 80% by week 12 Date Initiated: 09/28/2017 Target Resolution Date: 01/15/2018 Goal Status: Active Interventions: Assess patient/caregiver ability to perform ulcer/skin care regimen upon admission and as needed Assess ulceration(s) every visit Notes: Electronic Signature(s) Signed: 10/08/2017 4:29:16 PM By: Alejandro Mulling Entered By: Alejandro Mulling on 10/05/2017 15:50:53 [name] ([id number]) -------------------------------------------------------------------------------- Pain Assessment Details Patient Name: [name] Date of Service: 10/05/2017 3:00 PM Medical Record Number: [id number] Patient Account Number: [id number] Date of Birth/Sex: 1991-02-10 (26 y.o. M) Treating RN: Curtis Sites Primary Care Thelton Graca: PATIENT, NO Other Clinician: Referring Chaquita Basques: Theora Master Treating Jhair Witherington/Extender: Linwood Dibbles,  HOYT Weeks in Treatment: 1 Active Problems Location of Pain Severity and Description of Pain Patient Has Paino No Site Locations Pain Management and Medication Current Pain Management: Electronic Signature(s) Signed: 10/05/2017 4:18:46 PM By: Curtis Sites Entered By: Curtis Sites on 10/05/2017 15:26:37 [name] ([id number]) -------------------------------------------------------------------------------- Patient/Caregiver Education Details Patient Name: [name] Date of Service: 10/05/2017 3:00 PM Medical Record Number: [id number] Patient Account Number: [id number] Date of Birth/Gender: 19-Mar-1991 (26 y.o. M) Treating RN: Renne Crigler Primary Care Physician: PATIENT, NO Other Clinician: Referring Physician: Theora Master Treating Physician/Extender: Skeet Simmer in Treatment: 1 Education Assessment Education Provided To: Patient Education Topics Provided Wound/Skin Impairment: Handouts: Caring for Your Ulcer Methods: Explain/Verbal Responses: State content correctly Electronic Signature(s) Signed: 10/05/2017 4:51:25 PM By: Renne Crigler Entered By: Renne Crigler on 10/05/2017 16:08:15 [name] ([id number]) -------------------------------------------------------------------------------- Wound Assessment Details Patient Name: [name] Date of Service: 10/05/2017 3:00 PM Medical Record Number: [id number] Patient Account Number: [id number] Date of Birth/Sex: [birth year] (26 y.o. M) Treating RN: Curtis Sites Primary Care Kinsleigh Ludolph: PATIENT, NO Other Clinician: Referring Davey Bergsma: Theora Master Treating Curran Lenderman/Extender: STONE III, HOYT Weeks in Treatment: 1 Wound Status Wound Number: 1 Primary Etiology: Open Surgical Wound Wound Location: Left Forehead Wound Status: Open Wounding Event: Surgical Injury Comorbid History: Seizure Disorder Date Acquired: 08/03/2017 Weeks Of Treatment: 1 Clustered Wound: No Photos Photo Uploaded  By: Curtis Sites on 10/05/2017 16:14:42 Wound Measurements Length: (cm) 0.3 Width: (cm) 0.3 Depth: (cm) 0.2 Area: (cm) 0.071 Volume: (cm) 0.014 % Reduction in Area: -129% % Reduction in Volume: -55.6% Epithelialization: None Tunneling: Yes Position (o'clock): 11 Maximum Distance: (cm) 2.2 Undermining: No Wound Description Full Thickness Without Exposed Support Classification: Structures Wound Margin: Flat and Intact Exudate Medium Amount: Exudate Type: Serous Exudate Color: amber Foul Odor After Cleansing: No Slough/Fibrino Yes Wound Bed Granulation Amount: Large (67-100%) Exposed Structure Granulation Quality: Red Fascia Exposed: No Necrotic Amount: None Present (0%) Fat Layer (Subcutaneous Tissue) Exposed: No Tendon Exposed: No Mcguirt, Demitrios ([id number]) Muscle Exposed: No Joint Exposed: No Bone Exposed: No Periwound Skin Texture Texture Color No Abnormalities Noted: No No Abnormalities Noted: No Callus: No Atrophie Blanche: No Crepitus: No Cyanosis: No Excoriation: No Ecchymosis: No Induration: No Erythema: No Rash: No Hemosiderin Staining: No Scarring: Yes Mottled: No Pallor: No Moisture Rubor: No No Abnormalities Noted: No Dry / Scaly: No Temperature / Pain Maceration: No Temperature: No Abnormality Wound Preparation Ulcer Cleansing: Rinsed/Irrigated with Saline Topical Anesthetic Applied: Other: lidocaine 4%, Treatment Notes Wound #1 (Left Forehead) 1. Cleansed with: Clean wound with Normal Saline 2. Anesthetic Topical Lidocaine 4% cream to wound bed prior to debridement 4. Dressing Applied: Iodoform packing Gauze Notes coverlet Electronic Signature(s) Signed: 10/05/2017 4:18:46 PM By: Curtis Sites Signed: 10/08/2017 4:29:16 PM By: Alejandro Mulling Entered By: Alejandro Mulling on 10/05/2017 15:52:56 RENZO, VINCELETTE ([id number]) -------------------------------------------------------------------------------- Vitals  Details Patient Name: [name] Date of Service: 10/05/2017 3:00 PM Medical Record Number: [id number] Patient Account Number: [id number] Date of Birth/Sex: [birth year] (26 y.o. M) Treating RN: Curtis Sites Primary Care Garen Woolbright: PATIENT, NO Other Clinician: Referring Shauntel Prest: Theora Master Treating Johnryan Sao/Extender: STONE III, HOYT Weeks in Treatment: 1 Vital Signs Time Taken: 15:28 Temperature (F): 98.3 Height (in): 69 Pulse (bpm): 74 Weight (lbs): 160 Respiratory Rate (breaths/min): 18 Body Mass Index (BMI): 23.6 Blood Pressure (mmHg): 114/67 Reference Range: 80 - 120 mg / dl Electronic Signature(s) Signed: 10/05/2017 4:18:46 PM By: Curtis Sites Entered By: Curtis Sites on 10/05/2017 15:28:53

## 2017-10-13 ENCOUNTER — Ambulatory Visit: Admission: RE | Admit: 2017-10-13 | Payer: BLUE CROSS/BLUE SHIELD | Source: Ambulatory Visit

## 2017-10-19 ENCOUNTER — Ambulatory Visit: Payer: BLUE CROSS/BLUE SHIELD | Admitting: Physician Assistant

## 2019-05-02 ENCOUNTER — Encounter: Payer: Self-pay | Admitting: *Deleted

## 2019-05-02 ENCOUNTER — Ambulatory Visit
Admission: EM | Admit: 2019-05-02 | Discharge: 2019-05-02 | Disposition: A | Discharge status: Home / Self Care | Payer: BLUE CROSS/BLUE SHIELD | Attending: Emergency Medicine | Admitting: Emergency Medicine

## 2019-05-02 ENCOUNTER — Other Ambulatory Visit: Payer: Self-pay

## 2019-05-02 DIAGNOSIS — M546 Pain in thoracic spine: Secondary | ICD-10-CM

## 2019-05-02 HISTORY — DX: Unspecified convulsions: R56.9

## 2019-05-02 MED ORDER — IBUPROFEN 800 MG PO TABS: 800.0000 mg | ORAL_TABLET | Freq: Three times a day (TID) | ORAL | 0 refills | Status: DC | PRN

## 2019-05-02 MED ORDER — CYCLOBENZAPRINE HCL 10 MG PO TABS: 10.0000 mg | ORAL_TABLET | Freq: Two times a day (BID) | ORAL | 0 refills | Status: DC | PRN

## 2019-05-02 NOTE — Discharge Instructions (Addendum)
Take the prescribed ibuprofen as needed for your pain.  Take the muscle relaxer Flexeril as needed for muscle spasm; do not drive, operate machinery, or drink alcohol with this medication as it may make you drowsy.   ° °Follow up with an orthopedist if your pain is not improving.  Go to the emergency department if you have worsening pain or develop new symptoms such as difficulty with urination, weakness, numbness, loss of control of your bladder or bowels, fever, or chills. ° °

## 2019-05-02 NOTE — ED Provider Notes (Signed)
[name]    CSN: [id number] Arrival date & time: 05/02/19  1138      History   Chief Complaint Chief Complaint  Patient presents with  . Back Pain    HPI [name] is a 28 y.o. male.   Patient presents with right side back pain and muscle spasm after helping a friend move yesterday.  He denies numbness, paresthesias, weakness, fever, dysuria, abdominal pain, bowel/bladder incontinence, or other symptoms.  He attempted treatment at home with icy hot without relief.  He states the pain is worse with twisting and movement.  He states it is better with rest.  The history is provided by the patient.    Past Medical History:  Diagnosis Date  . Seizures ([place])     There are no active problems to display for this patient.   Past Surgical History:  Procedure Laterality Date  . BRAIN SURGERY         Home Medications    Prior to Admission medications   Medication Sig Start Date End Date Taking? Authorizing Provider  cyclobenzaprine (FLEXERIL) 10 MG tablet Take 1 tablet (10 mg total) by mouth 2 (two) times daily as needed for muscle spasms. 05/02/19   Sharion Balloon, NP  ibuprofen (ADVIL) 800 MG tablet Take 1 tablet (800 mg total) by mouth every 8 (eight) hours as needed. 05/02/19   Sharion Balloon, NP    Family History Family History  Problem Relation Age of Onset  . Healthy Mother   . Healthy Father     Social History Social History   Tobacco Use  . Smoking status: Never Smoker  . Smokeless tobacco: Never Used  Substance Use Topics  . Alcohol use: Never    Frequency: Never  . Drug use: Not on file     Allergies   Patient has no known allergies.   Review of Systems Review of Systems  Constitutional: Negative for chills and fever.  HENT: Negative for ear pain and sore throat.   Eyes: Negative for pain and visual disturbance.  Respiratory: Negative for cough and shortness of breath.   Cardiovascular: Negative for chest pain and  palpitations.  Gastrointestinal: Negative for abdominal pain and vomiting.  Genitourinary: Negative for dysuria and hematuria.  Musculoskeletal: Positive for back pain. Negative for arthralgias.  Skin: Negative for color change and rash.  Neurological: Negative for seizures, syncope, weakness and numbness.  All other systems reviewed and are negative.    Physical Exam Triage Vital Signs ED Triage Vitals  Enc Vitals Group     BP      Pulse      Resp      Temp      Temp src      SpO2      Weight      Height      Head Circumference      Peak Flow      Pain Score      Pain Loc      Pain Edu?      Excl. in [place]?    No data found.  Updated Vital Signs BP 107/69   Pulse (!) 56   Temp 97.6 F (36.4 C) (Oral)   Resp 16   SpO2 97%   Visual Acuity Right Eye Distance:   Left Eye Distance:   Bilateral Distance:    Right Eye Near:   Left Eye Near:    Bilateral Near:     Physical Exam Vitals  signs and nursing note reviewed.  Constitutional:      General: He is not in acute distress.    Appearance: He is well-developed. He is not ill-appearing.  HENT:     Head: Normocephalic and atraumatic.     Mouth/Throat:     Mouth: Mucous membranes are moist.  Eyes:     Conjunctiva/sclera: Conjunctivae normal.  Neck:     Musculoskeletal: Neck supple.  Cardiovascular:     Rate and Rhythm: Normal rate and regular rhythm.     Heart sounds: No murmur.  Pulmonary:     Effort: Pulmonary effort is normal. No respiratory distress.     Breath sounds: Normal breath sounds.  Abdominal:     General: Bowel sounds are normal. There is no distension.     Palpations: Abdomen is soft.     Tenderness: There is no abdominal tenderness. There is no right CVA tenderness, left CVA tenderness, guarding or rebound.  Musculoskeletal: Normal range of motion.        General: No swelling, tenderness, deformity or signs of injury.  Skin:    General: Skin is warm and dry.     Capillary Refill:  Capillary refill takes less than 2 seconds.  Neurological:     General: No focal deficit present.     Mental Status: He is alert and oriented to person, place, and time.     Sensory: No sensory deficit.     Motor: No weakness.     Gait: Gait normal.      UC Treatments / Results  Labs (all labs ordered are listed, but only abnormal results are displayed) Labs Reviewed - No data to display  EKG   Radiology No results found.  Procedures Procedures (including critical care time)  Medications Ordered in UC Medications - No data to display  Initial Impression / Assessment and Plan / UC Course  I have reviewed the triage vital signs and the nursing notes.  Pertinent labs & imaging results that were available during my care of the patient were reviewed by me and considered in my medical decision making (see chart for details).    Acute right back pain.  Treating with ibuprofen and Flexeril.  Precautions for drowsiness with Flexeril discussed with patient.  Instructed him to follow-up with an orthopedist if his symptoms or not improving.  Instructed him to go to the ED if he has increased pain or develops new symptoms such as weakness, numbness, dysuria, bowel or bladder incontinence, fever, or other concerns.  Patient agrees to plan of care.     Final Clinical Impressions(s) / UC Diagnoses   Final diagnoses:  Acute right-sided thoracic back pain     Discharge Instructions     Take the prescribed ibuprofen as needed for your pain.  Take the muscle relaxer Flexeril as needed for muscle spasm; do not drive, operate machinery, or drink alcohol with this medication as it may make you drowsy.    Follow up with an orthopedist if your pain is not improving.  Go to the emergency department if you have worsening pain or develop new symptoms such as difficulty with urination, weakness, numbness, loss of control of your bladder or bowels, fever, or chills.      ED Prescriptions     Medication Sig Dispense Auth. Provider   ibuprofen (ADVIL) 800 MG tablet Take 1 tablet (800 mg total) by mouth every 8 (eight) hours as needed. 21 tablet Mickie Bail, NP   cyclobenzaprine (FLEXERIL) 10  MG tablet Take 1 tablet (10 mg total) by mouth 2 (two) times daily as needed for muscle spasms. 20 tablet Mickie Bailate, Latreshia Beauchaine H, NP     I have reviewed the PDMP during this encounter.   Mickie Bailate, Shalicia Craghead H, NP 05/02/19 1213

## 2019-05-02 NOTE — ED Triage Notes (Signed)
Patient reports right low/mid back pain that started last night, patient was helping move a dresser when he experienced the pain. Pain with movement, especially turning. No numbness or tingling to lower extremities.

## 2019-06-02 ENCOUNTER — Encounter: Payer: Self-pay | Admitting: Emergency Medicine

## 2019-06-02 ENCOUNTER — Other Ambulatory Visit: Payer: Self-pay

## 2019-06-02 ENCOUNTER — Ambulatory Visit
Admission: EM | Admit: 2019-06-02 | Discharge: 2019-06-02 | Disposition: A | Discharge status: Home / Self Care | Payer: HRSA Program

## 2019-06-02 DIAGNOSIS — Z20822 Contact with and (suspected) exposure to covid-19: Secondary | ICD-10-CM

## 2019-06-02 DIAGNOSIS — Z20828 Contact with and (suspected) exposure to other viral communicable diseases: Secondary | ICD-10-CM

## 2019-06-02 NOTE — Discharge Instructions (Signed)
Stay in home isolation until you receive results of your COVID test. You will only be notified for positive results. You may go online to MyChart in the next few days and review your results. Please follow CDC guidelines that are attached.   You may discontinue home isolation when there has been 14 days since your exposure and you haven't developed any symptoms. If your test is negative and you develop symptoms later, then you may need to be re-tested  Merry Christmas!  Aldona Bar, FNP-C

## 2019-06-02 NOTE — ED Provider Notes (Signed)
[facility]    CSN: [id number] Arrival date & time: 06/02/19  1140      History   Chief Complaint Chief Complaint  Patient presents with   covid Test    HPI [name] is a 28 y.o. male.   Subjective:   [name] is a 28 y.o. male who presents for COVID testing after known exposure to COVID-19.  Patient currently denies any symptoms. Specifically, no fever, chills, headache, body aches, sore throat, cough, shortness of breath, nausea, vomiting, diarrhea, dizziness or change in taste/smell. Patient has no identified high risk factors for COVID-19 complications.   The following portions of the patient's history were reviewed and updated as appropriate: allergies, current medications, past family history, past medical history, past social history, past surgical history and problem list.       Past Medical History:  Diagnosis Date   Seizures (HCC)     There are no problems to display for this patient.   Past Surgical History:  Procedure Laterality Date   BRAIN SURGERY         Home Medications    Prior to Admission medications   Medication Sig Start Date End Date Taking? Authorizing Provider  levETIRAcetam (KEPPRA) 1000 MG tablet TAKE 1.5 TABLETS (1,500 MG TOTAL) BY MOUTH 2 (TWO) TIMES DAILY 05/29/19  Yes [provider]    Family History Family History  Problem Relation Age of Onset   Healthy Mother    Healthy Father     Social History Social History   Tobacco Use   Smoking status: Never Smoker   Smokeless tobacco: Never Used  Substance Use Topics   Alcohol use: Never   Drug use: Not on file     Allergies   Patient has no known allergies.   Review of Systems Review of Systems  Constitutional: Negative.   HENT: Negative.   Respiratory: Negative.   Cardiovascular: Negative.   Gastrointestinal: Negative.   Musculoskeletal: Negative.   Neurological: Negative.      Physical Exam Triage Vital Signs ED  Triage Vitals  Enc Vitals Group     BP 06/02/19 1147 110/68     Pulse Rate 06/02/19 1147 64     Resp 06/02/19 1147 18     Temp 06/02/19 1147 98.2 F (36.8 C)     Temp src --      SpO2 06/02/19 1147 98 %     Weight 06/02/19 1144 160 lb (72.6 kg)     Height --      Head Circumference --      Peak Flow --      Pain Score --      Pain Loc --      Pain Edu? --      Excl. in GC? --    No data found.  Updated Vital Signs BP 110/68    Pulse 64    Temp 98.2 F (36.8 C)    Resp 18    Wt 160 lb (72.6 kg)    SpO2 98%   Visual Acuity Right Eye Distance:   Left Eye Distance:   Bilateral Distance:    Right Eye Near:   Left Eye Near:    Bilateral Near:     Physical Exam Vitals reviewed.  Constitutional:      Appearance: Normal appearance.  HENT:     Head: Normocephalic.  Cardiovascular:     Rate and Rhythm: Normal rate and regular rhythm.  Pulmonary:     Effort:  Pulmonary effort is normal.     Breath sounds: Normal breath sounds.  Musculoskeletal:        General: Normal range of motion.     Cervical back: Normal range of motion and neck supple.  Skin:    General: Skin is warm and dry.  Neurological:     General: No focal deficit present.     Mental Status: He is alert and oriented to person, place, and time.      UC Treatments / Results  Labs (all labs ordered are listed, but only abnormal results are displayed) Labs Reviewed  NOVEL CORONAVIRUS, NAA    EKG   Radiology No results found.  Procedures Procedures (including critical care time)  Medications Ordered in UC Medications - No data to display  Initial Impression / Assessment and Plan / UC Course  I have reviewed the triage vital signs and the nursing notes.  Pertinent labs & imaging results that were available during my care of the patient were reviewed by me and considered in my medical decision making (see chart for details).    28 yo male with no identified high risk factors presents for COVID  testing after known exposure. He is asymptomatic.  Afebrile.  Nontoxic-appearing.  Covid PCR test pending.  Isolation for 14 days from known exposure advised.  Monitor closely for symptoms.  Return as needed.  Today's evaluation has revealed no signs of a dangerous process. Discussed diagnosis with patient and/or guardian. Patient and/or guardian aware of their diagnosis, possible red flag symptoms to watch out for and need for close follow up. Patient and/or guardian understands verbal and written discharge instructions. Patient and/or guardian comfortable with plan and disposition.  Patient and/or guardian has a clear mental status at this time, good insight into illness (after discussion and teaching) and has clear judgment to make decisions regarding their care  This care was provided during an unprecedented National Emergency due to the Novel Coronavirus (COVID-19) pandemic. COVID-19 infections and transmission risks place heavy strains on healthcare resources.  As this pandemic evolves, our facility, providers, and staff strive to respond fluidly, to remain operational, and to provide care relative to available resources and information. Outcomes are unpredictable and treatments are without well-defined guidelines. Further, the impact of COVID-19 on all aspects of urgent care, including the impact to patients seeking care for reasons other than COVID-19, is unavoidable during this national emergency. At this time of the global pandemic, management of patients has significantly changed, even for non-COVID positive patients given high local and regional COVID volumes at this time requiring high healthcare system and resource utilization. The standard of care for management of both COVID suspected and non-COVID suspected patients continues to change rapidly at the local, regional, national, and global levels. This patient was worked up and treated to the best available but ever changing evidence and resources  available at this current time.   Documentation was completed with the aid of voice recognition software. Transcription may contain typographical errors.   Final Clinical Impressions(s) / UC Diagnoses   Final diagnoses:  Exposure to SARS-associated coronavirus  Encounter for screening laboratory testing for COVID-19 virus in asymptomatic patient     Discharge Instructions     Stay in home isolation until you receive results of your COVID test. You will only be notified for positive results. You may go online to MyChart in the next few days and review your results. Please follow CDC guidelines that are attached.   You may  discontinue home isolation when there has been 14 days since your exposure and you haven't developed any symptoms. If your test is negative and you develop symptoms later, then you may need to be re-tested  Merry Christmas!  Aldona Bar, FNP-C      ED Prescriptions    None     PDMP not reviewed this encounter.   Enrique Sack, [place] 06/02/19 1204

## 2019-06-02 NOTE — ED Triage Notes (Signed)
Patient in office today for covid test only No symptoms

## 2019-07-09 ENCOUNTER — Emergency Department
Admission: EM | Admit: 2019-07-09 | Discharge: 2019-07-09 | Disposition: A | Discharge status: Home / Self Care | Payer: Self-pay | Attending: Student | Admitting: Student

## 2019-07-09 ENCOUNTER — Emergency Department: Payer: Self-pay

## 2019-07-09 DIAGNOSIS — R112 Nausea with vomiting, unspecified: Secondary | ICD-10-CM | POA: Insufficient documentation

## 2019-07-09 DIAGNOSIS — R1033 Periumbilical pain: Secondary | ICD-10-CM | POA: Insufficient documentation

## 2019-07-09 DIAGNOSIS — Z79899 Other long term (current) drug therapy: Secondary | ICD-10-CM | POA: Insufficient documentation

## 2019-07-09 DIAGNOSIS — Z20822 Contact with and (suspected) exposure to covid-19: Secondary | ICD-10-CM | POA: Insufficient documentation

## 2019-07-09 LAB — COMPREHENSIVE METABOLIC PANEL
ALT: 13 U/L (ref 0–44)
AST: 35 U/L (ref 15–41)
Albumin: 4.4 g/dL (ref 3.5–5.0)
Alkaline Phosphatase: 41 U/L (ref 38–126)
Anion gap: 9 (ref 5–15)
BUN: 20 mg/dL (ref 6–20)
CO2: 24 mmol/L (ref 22–32)
Calcium: 9.3 mg/dL (ref 8.9–10.3)
Chloride: 104 mmol/L (ref 98–111)
Creatinine, Ser: 1.73 mg/dL — ABNORMAL HIGH (ref 0.61–1.24)
GFR calc Af Amer: >60 mL/min (ref >60)
GFR calc non Af Amer: 53 mL/min — ABNORMAL LOW (ref >60)
Glucose, Bld: 103 mg/dL — ABNORMAL HIGH (ref 70–99)
Potassium: 3.5 mmol/L (ref 3.5–5.1)
Sodium: 137 mmol/L (ref 135–145)
Total Bilirubin: 1.6 mg/dL — ABNORMAL HIGH (ref 0.3–1.2)
Total Protein: 8.2 g/dL — ABNORMAL HIGH (ref 6.5–8.1)

## 2019-07-09 LAB — URINALYSIS, COMPLETE (UACMP) WITH MICROSCOPIC
Bacteria, UA: NONE SEEN
Bilirubin Urine: NEGATIVE
Glucose, UA: NEGATIVE mg/dL
Hgb urine dipstick: NEGATIVE
Ketones, ur: NEGATIVE mg/dL
Leukocytes,Ua: NEGATIVE
Nitrite: NEGATIVE
Protein, ur: NEGATIVE mg/dL
Specific Gravity, Urine: 1.039 — ABNORMAL HIGH (ref 1.005–1.030)
Squamous Epithelial / HPF: NONE SEEN (ref 0–5)
pH: 5 (ref 5.0–8.0)

## 2019-07-09 LAB — CBC
HCT: 43.5 % (ref 39.0–52.0)
Hemoglobin: 14.8 g/dL (ref 13.0–17.0)
MCH: 27.2 pg (ref 26.0–34.0)
MCHC: 34 g/dL (ref 30.0–36.0)
MCV: 80 fL (ref 80.0–100.0)
Platelets: 295 10*3/uL (ref 150–400)
RBC: 5.44 MIL/uL (ref 4.22–5.81)
RDW: 13.2 % (ref 11.5–15.5)
WBC: 7.2 10*3/uL (ref 4.0–10.5)
nRBC: 0 % (ref 0.0–0.2)

## 2019-07-09 LAB — URINE DRUG SCREEN, QUALITATIVE (ARMC ONLY)
Amphetamines, Ur Screen: NOT DETECTED
Barbiturates, Ur Screen: NOT DETECTED
Benzodiazepine, Ur Scrn: NOT DETECTED
Cannabinoid 50 Ng, Ur ~~LOC~~: POSITIVE — AB
Cocaine Metabolite,Ur ~~LOC~~: NOT DETECTED
MDMA (Ecstasy)Ur Screen: NOT DETECTED
Methadone Scn, Ur: NOT DETECTED
Opiate, Ur Screen: NOT DETECTED
Phencyclidine (PCP) Ur S: NOT DETECTED
Tricyclic, Ur Screen: NOT DETECTED

## 2019-07-09 LAB — POC SARS CORONAVIRUS 2 AG: SARS Coronavirus 2 Ag: NEGATIVE

## 2019-07-09 LAB — LIPASE, BLOOD: Lipase: 19 U/L (ref 11–51)

## 2019-07-09 MED ORDER — ONDANSETRON HCL 4 MG PO TABS: 4.0000 mg | ORAL_TABLET | Freq: Three times a day (TID) | ORAL | 0 refills | Status: AC | PRN

## 2019-07-09 MED ORDER — ONDANSETRON HCL 4 MG/2ML IJ SOLN
4.0000 mg | Freq: Once | INTRAMUSCULAR | Status: AC
  Administered 2019-07-09: 4 mg via INTRAVENOUS
  Filled 2019-07-09: qty 2

## 2019-07-09 MED ORDER — SODIUM CHLORIDE 0.9 % IV BOLUS
1000.0000 mL | Freq: Once | INTRAVENOUS | Status: AC
  Administered 2019-07-09: 1000 mL via INTRAVENOUS

## 2019-07-09 MED ORDER — IOHEXOL 300 MG/ML  SOLN
100.0000 mL | Freq: Once | INTRAMUSCULAR | Status: AC | PRN
  Administered 2019-07-09: 100 mL via INTRAVENOUS

## 2019-07-09 NOTE — Discharge Instructions (Signed)
Thank you for letting us take care of you in the emergency department today.   Please continue to take any regular, prescribed medications. Avoid marijuana use as it can worsen your symptoms.   New medications we have prescribed:  - Zofran, as needed for nausea  Please follow up with: - Your primary care doctor to review your ER visit and follow up on your symptoms.   Please return to the ER for any new or worsening symptoms.

## 2019-07-09 NOTE — ED Notes (Signed)
Pt updated on wait for ct results. Pt informed family called to check on him. Call bell at left side. Pt requesting po fluids, updated that will need to wait for ct results.

## 2019-07-09 NOTE — ED Triage Notes (Signed)
Patient to ED for two days of abdominal pain. Points to the umbilical area as area of pain. States he has been vomiting. Denies diarrhea stating "I haven't used the bathroom."

## 2019-07-09 NOTE — ED Notes (Signed)
Pt to ct 

## 2019-07-09 NOTE — ED Notes (Signed)
Pt provided with po fluids with md consent.

## 2019-07-09 NOTE — ED Provider Notes (Signed)
[facility] Emergency Department Provider Note  ____________________________________________   First MD Initiated Contact with Patient 07/09/19 2029     (approximate)  I have reviewed the triage vital signs and the nursing notes.  History  Chief Complaint Abdominal Pain    HPI [name] is a 29 y.o. male with hx of seizures who presents emergency department for abdominal pain, nausea, vomiting.  Symptoms have been present for the last 72 hours and have been persistent since onset.  He reports pain to the periumbilical area, cramping/aching.  8/10 in severity, constant, no radiation, no alleviating or aggravating components.  Associated with multiple episodes of nausea and nonbloody, nonbilious emesis.  No diarrhea.  Last bowel movement was a few days ago and normal.  No fevers.  Denies any new foods, antibiotics, travel.  No sick contacts.  Denies any recent marijuana use.   Past Medical Hx Past Medical History:  Diagnosis Date  . Seizures (HCC)     Problem List There are no problems to display for this patient.   Past Surgical Hx Past Surgical History:  Procedure Laterality Date  . BRAIN SURGERY      Medications Prior to Admission medications   Medication Sig Start Date End Date Taking? Authorizing Provider  levETIRAcetam (KEPPRA) 1000 MG tablet TAKE 1.5 TABLETS (1,500 MG TOTAL) BY MOUTH 2 (TWO) TIMES DAILY 05/29/19   [provider]    Allergies Patient has no known allergies.  Family Hx Family History  Problem Relation Age of Onset  . Healthy Mother   . Healthy Father     Social Hx Social History   Tobacco Use  . Smoking status: Never Smoker  . Smokeless tobacco: Never Used  Substance Use Topics  . Alcohol use: Never  . Drug use: Never     Review of Systems  Constitutional: Negative for fever, chills. Eyes: Negative for visual changes. ENT: Negative for sore throat. Cardiovascular: Negative for chest  pain. Respiratory: Negative for shortness of breath. Gastrointestinal: + for nausea, vomiting, abdominal pain.  Genitourinary: Negative for dysuria. Musculoskeletal: Negative for leg swelling. Skin: Negative for rash. Neurological: Negative for for headaches.   Physical Exam  Vital Signs: ED Triage Vitals  Enc Vitals Group     BP 07/09/19 2005 131/75     Pulse Rate 07/09/19 2005 76     Resp 07/09/19 2005 18     Temp 07/09/19 2005 98.9 F (37.2 C)     Temp Source 07/09/19 2005 Oral     SpO2 07/09/19 2005 98 %     Weight 07/09/19 2006 160 lb (72.6 kg)     Height 07/09/19 2006 5\' 9"  (1.753 m)     Head Circumference --      Peak Flow --      Pain Score 07/09/19 2006 8     Pain Loc --      Pain Edu? --      Excl. in GC? --     Constitutional: Alert and oriented.  Head: Normocephalic. Atraumatic. Eyes: Conjunctivae clear. Sclera anicteric. Nose: No congestion. No rhinorrhea. Mouth/Throat: Wearing mask.  Neck: No stridor.   Cardiovascular: Normal rate, regular rhythm. Extremities well perfused. Respiratory: Normal respiratory effort.  Lungs CTAB. Gastrointestinal: Soft.  Mild tenderness periumbilically, no rebound or guarding.  No significant distention. Musculoskeletal: No lower extremity edema. No deformities. Neurologic:  Normal speech and language. No gross focal neurologic deficits are appreciated.  Skin: Skin is warm, dry and intact. No rash noted.  Psychiatric: Mood and affect are appropriate for situation.   Radiology  CT: IMPRESSION: 1. Normal appendix in the right lower quadrant. 2. Slightly striated appearance of both kidneys. Correlation with urinalysis is recommended to help exclude underlying pyelonephritis.     Procedures  Procedure(s) performed (including critical care):  Procedures   Initial Impression / Assessment and Plan / ED Course  29 y.o. male who presents to the ED for nausea, vomiting, periumbilical abdominal pain  Ddx: appendicitis,  gastroenteritis, COVID, other viral process, marijuana hyperemesis  Will obtain labs, imaging, symptom control and reassess  Imaging negative.  Creatinine mildly elevated 1.73, no priors to compare to, likely in the setting of fluid losses.  Urine positive for cannabis.  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  Patient feeling improved after fluids and Zofran, able to tolerate PO in the ED.  As such, patient stable for discharge.  Will provide Rx for Zofran as needed.  Given return precautions.  Discussed importance of cannabis cessation with relation to his symptoms.   Final Clinical Impression(s) / ED Diagnosis  Final diagnoses:  Periumbilical abdominal pain  Nausea and vomiting in adult       Note:  This document was prepared using Dragon voice recognition software and may include unintentional dictation errors.   Lilia Pro., MD 07/09/19 [phone]

## 2019-09-03 ENCOUNTER — Other Ambulatory Visit: Payer: Self-pay

## 2019-09-03 DIAGNOSIS — Y9389 Activity, other specified: Secondary | ICD-10-CM | POA: Insufficient documentation

## 2019-09-03 DIAGNOSIS — S6991XA Unspecified injury of right wrist, hand and finger(s), initial encounter: Secondary | ICD-10-CM | POA: Diagnosis present

## 2019-09-03 DIAGNOSIS — W25XXXA Contact with sharp glass, initial encounter: Secondary | ICD-10-CM | POA: Insufficient documentation

## 2019-09-03 DIAGNOSIS — Y998 Other external cause status: Secondary | ICD-10-CM | POA: Insufficient documentation

## 2019-09-03 DIAGNOSIS — S61210A Laceration without foreign body of right index finger without damage to nail, initial encounter: Secondary | ICD-10-CM | POA: Insufficient documentation

## 2019-09-03 DIAGNOSIS — Y929 Unspecified place or not applicable: Secondary | ICD-10-CM | POA: Insufficient documentation

## 2019-09-03 NOTE — ED Triage Notes (Signed)
Pt states lacerated right index finger on drinking glass while washing dishes at 2300. Bleeding controlled, small approx 3 cm laceration over middle knuckle.

## 2019-09-04 ENCOUNTER — Emergency Department
Admission: EM | Admit: 2019-09-04 | Discharge: 2019-09-04 | Disposition: A | Discharge status: Home / Self Care | Payer: BLUE CROSS/BLUE SHIELD | Attending: Emergency Medicine | Admitting: Emergency Medicine

## 2019-09-04 DIAGNOSIS — S61210A Laceration without foreign body of right index finger without damage to nail, initial encounter: Secondary | ICD-10-CM

## 2019-09-04 MED ORDER — LIDOCAINE HCL (PF) 1 % IJ SOLN
5.0000 mL | Freq: Once | INTRAMUSCULAR | Status: DC
  Filled 2019-09-04: qty 5

## 2019-09-04 NOTE — ED Provider Notes (Signed)
[facility] Emergency Department Provider Note  ____________________________________________   First MD Initiated Contact with Patient 09/04/19 [phone]     (approximate)  I have reviewed the triage vital signs and the nursing notes.   HISTORY  Chief Complaint Laceration    HPI [name] is a 29 y.o. male with history of seizure disorder presents emergency department secondary to accidental laceration to the right index finger while washing glass today that broke.       Past Medical History:  Diagnosis Date  . Seizures ([place])     There are no problems to display for this patient.   Past Surgical History:  Procedure Laterality Date  . BRAIN SURGERY      Prior to Admission medications   Medication Sig Start Date End Date Taking? Authorizing Provider  levETIRAcetam (KEPPRA) 1000 MG tablet TAKE 1.5 TABLETS (1,500 MG TOTAL) BY MOUTH 2 (TWO) TIMES DAILY 05/29/19   [provider]    Allergies Patient has no known allergies.  Family History  Problem Relation Age of Onset  . Healthy Mother   . Healthy Father     Social History Social History   Tobacco Use  . Smoking status: Never Smoker  . Smokeless tobacco: Never Used  Substance Use Topics  . Alcohol use: Never  . Drug use: Never    Review of Systems Constitutional: No fever/chills Eyes: No visual changes. ENT: No sore throat. Cardiovascular: Denies chest pain. Respiratory: Denies shortness of breath. Gastrointestinal: No abdominal pain.  No nausea, no vomiting.  No diarrhea.  No constipation. Genitourinary: Negative for dysuria. Musculoskeletal: Negative for neck pain.  Negative for back pain. Integumentary: Negative for rash.  Positive for right index finger laceration Neurological: Negative for headaches, focal weakness or numbness.  ____________________________________________   PHYSICAL EXAM:  VITAL SIGNS: ED Triage Vitals  Enc Vitals Group     BP  09/03/19 2339 (!) 117/48     Pulse Rate 09/03/19 2339 68     Resp 09/03/19 2339 16     Temp 09/03/19 2339 98.2 F (36.8 C)     Temp Source 09/03/19 2339 Oral     SpO2 09/03/19 2339 100 %     Weight 09/03/19 2340 73.5 kg (162 lb)     Height 09/03/19 2340 1.753 m (5\' 9" )     Head Circumference --      Peak Flow --      Pain Score 09/03/19 2340 6     Pain Loc --      Pain Edu? --      Excl. in [place]? --     Constitutional: Alert and oriented.  Eyes: Conjunctivae are normal.  Mouth/Throat: Patient is wearing a mask. Neck: No stridor.  No meningeal signs.   Cardiovascular: Normal rate, regular rhythm. Good peripheral circulation. Grossly normal heart sounds. Neurologic:  Normal speech and language. No gross focal neurologic deficits are appreciated.  Skin: 3 cm linear laceration to the right index finger bleeding controlled Psychiatric: Mood and affect are normal. Speech and behavior are normal.  ____________  .Marland KitchenLaceration Repair  Date/Time: 09/04/2019 3:39 AM Performed by: Gregor Hams, MD Authorized by: Gregor Hams, MD   Consent:    Consent obtained:  Verbal   Consent given by:  Patient   Risks discussed:  Infection, pain, retained foreign body, poor cosmetic result and poor wound healing Anesthesia (see MAR for exact dosages):    Anesthesia method:  Local infiltration   Local anesthetic:  Lidocaine 1% w/o epi Laceration details:    Location:  Finger   Finger location:  R index finger   Length (cm):  3 Repair type:    Repair type:  Simple Exploration:    Hemostasis achieved with:  Direct pressure   Wound exploration: entire depth of wound probed and visualized     Contaminated: no   Treatment:    Area cleansed with:  Saline   Amount of cleaning:  Extensive   Irrigation solution:  Sterile saline   Visualized foreign bodies/material removed: no   Skin repair:    Repair method:  Sutures   Suture size:  5-0   Suture technique:  Simple interrupted   Number  of sutures:  4 Approximation:    Approximation:  Close Post-procedure details:    Dressing:  Sterile dressing   Patient tolerance of procedure:  Tolerated well, no immediate complications     ____________________________________________   INITIAL IMPRESSION / MDM / ASSESSMENT AND PLAN / ED COURSE  As part of my medical decision making, I reviewed the following data within the electronic MEDICAL RECORD NUMBER   29 year old male presented with above-stated history and physical exam secondary to right index finger laceration repaired without difficulty  ____________________________________________  FINAL CLINICAL IMPRESSION(S) / ED DIAGNOSES  Final diagnoses:  Laceration of right index finger without foreign body without damage to nail, initial encounter     MEDICATIONS GIVEN DURING THIS VISIT:  Medications  lidocaine (PF) (XYLOCAINE) 1 % injection 5 mL (has no administration in time range)     ED Discharge Orders    None      *Please note:  [name] was evaluated in Emergency Department on 09/04/2019 for the symptoms described in the history of present illness. He was evaluated in the context of the global COVID-19 pandemic, which necessitated consideration that the patient might be at risk for infection with the SARS-CoV-2 virus that causes COVID-19. Institutional protocols and algorithms that pertain to the evaluation of patients at risk for COVID-19 are in a state of rapid change based on information released by regulatory bodies including the CDC and federal and state organizations. These policies and algorithms were followed during the patient's care in the ED.  Some ED evaluations and interventions may be delayed as a result of limited staffing during the pandemic.*  Note:  This document was prepared using Dragon voice recognition software and may include unintentional dictation errors.   Darci Current, MD 09/04/19 [phone]

## 2019-09-04 NOTE — ED Notes (Signed)
ED Provider at bedside. 

## 2019-09-11 ENCOUNTER — Other Ambulatory Visit: Payer: Self-pay

## 2019-09-11 ENCOUNTER — Emergency Department
Admission: EM | Admit: 2019-09-11 | Discharge: 2019-09-11 | Disposition: A | Discharge status: Home / Self Care | Payer: BLUE CROSS/BLUE SHIELD | Attending: Emergency Medicine | Admitting: Emergency Medicine

## 2019-09-11 ENCOUNTER — Encounter: Payer: Self-pay | Admitting: Emergency Medicine

## 2019-09-11 DIAGNOSIS — Z4802 Encounter for removal of sutures: Secondary | ICD-10-CM | POA: Insufficient documentation

## 2019-09-11 DIAGNOSIS — S61210D Laceration without foreign body of right index finger without damage to nail, subsequent encounter: Secondary | ICD-10-CM | POA: Insufficient documentation

## 2019-09-11 DIAGNOSIS — X58XXXD Exposure to other specified factors, subsequent encounter: Secondary | ICD-10-CM | POA: Insufficient documentation

## 2019-09-11 NOTE — ED Triage Notes (Signed)
Here for suture removal.  No complications from stitches.

## 2019-09-11 NOTE — ED Provider Notes (Signed)
Emergency Department Provider Note  ____________________________________________  Time seen: Approximately 4:12 PM  I have reviewed the triage vital signs and the nursing notes.   HISTORY  Chief Complaint Suture / Staple Removal   Historian Patient    HPI [name] is a 29 y.o. male presents to the ED for suture removal.  Patient was seen 09/04/2019 for a right index finger laceration.  Patient has had no complications or concerns.   Past Medical History:  Diagnosis Date  . Seizures (HCC)      Immunizations up to date:  Yes.     Past Medical History:  Diagnosis Date  . Seizures (HCC)     There are no problems to display for this patient.   Past Surgical History:  Procedure Laterality Date  . BRAIN SURGERY      Prior to Admission medications   Medication Sig Start Date End Date Taking? Authorizing Provider  levETIRAcetam (KEPPRA) 1000 MG tablet TAKE 1.5 TABLETS (1,500 MG TOTAL) BY MOUTH 2 (TWO) TIMES DAILY 05/29/19   [provider]    Allergies Patient has no known allergies.  Family History  Problem Relation Age of Onset  . Healthy Mother   . Healthy Father     Social History Social History   Tobacco Use  . Smoking status: Never Smoker  . Smokeless tobacco: Never Used  Substance Use Topics  . Alcohol use: Never  . Drug use: Never     Review of Systems  Constitutional: No fever/chills Eyes:  No discharge ENT: No upper respiratory complaints. Respiratory: no cough. No SOB/ use of accessory muscles to breath Gastrointestinal:   No nausea, no vomiting.  No diarrhea.  No constipation. Musculoskeletal: Negative for musculoskeletal pain. Skin: Patient has healing right index finger laceration.     ____________________________________________   PHYSICAL EXAM:  VITAL SIGNS: ED Triage Vitals [09/11/19 1547]  Enc Vitals Group     BP 92/76     Pulse Rate 62     Resp 14     Temp 97.6 F (36.4 C)     Temp Source Oral   SpO2 100 %     Weight 162 lb (73.5 kg)     Height 5\' 9"  (1.753 m)     Head Circumference      Peak Flow      Pain Score 0     Pain Loc      Pain Edu?      Excl. in GC?      Constitutional: Alert and oriented. Well appearing and in no acute distress. Eyes: Conjunctivae are normal. PERRL. EOMI. Head: Atraumatic. Cardiovascular: Normal rate, regular rhythm. Normal S1 and S2.  Good peripheral circulation. Respiratory: Normal respiratory effort without tachypnea or retractions. Lungs CTAB. Good air entry to the bases with no decreased or absent breath sounds Gastrointestinal: Bowel sounds x 4 quadrants. Soft and nontender to palpation. No guarding or rigidity. No distention. Musculoskeletal: Full range of motion to all extremities. No obvious deformities noted Neurologic:  Normal for age. No gross focal neurologic deficits are appreciated.  Skin: Patient has 2 cm healing laceration along the dorsal aspect of right index finger Psychiatric: Mood and affect are normal for age. Speech and behavior are normal.   ____________________________________________   LABS (all labs ordered are listed, but only abnormal results are displayed)  Labs Reviewed - No data to display ____________________________________________  EKG   ____________________________________________  RADIOLOGY  No results found.  ____________________________________________    PROCEDURES  Procedure(s)  performed:     Procedures  SUTURE REMOVAL Performed by: Lannie Fields  Consent: Verbal consent obtained. Patient identity confirmed: provided demographic data Time out: Immediately prior to procedure a "time out" was called to verify the correct patient, procedure, equipment, support staff and site/side marked as required.  Location details: Dorsal aspect of right index finger.   Wound Appearance: clean  Sutures/Staples Removed: 4  Facility: sutures placed in this facility Patient tolerance:  Patient tolerated the procedure well with no immediate complications.      Medications - No data to display   ____________________________________________   INITIAL IMPRESSION / ASSESSMENT AND PLAN / ED COURSE  Pertinent labs & imaging results that were available during my care of the patient were reviewed by me and considered in my medical decision making (see chart for details).    Assessment and plan Suture removal 29 year old male presents to the emergency department for suture removal.  Sutures were removed from right index finger without complication.  All patient questions were answered. ____________________________________________  FINAL CLINICAL IMPRESSION(S) / ED DIAGNOSES  Final diagnoses:  Visit for suture removal      NEW MEDICATIONS STARTED DURING THIS VISIT:  ED Discharge Orders    None          This chart was dictated using voice recognition software/Dragon. Despite best efforts to proofread, errors can occur which can change the meaning. Any change was purely unintentional.     Lannie Fields, PA-C 09/11/19 1615    Nance Pear, MD 09/11/19 [phone]

## 2019-09-11 NOTE — ED Notes (Signed)
Pt discharged home after verbalizing understanding of discharge instructions; nad noted. 

## 2020-03-13 ENCOUNTER — Ambulatory Visit
Admission: EM | Admit: 2020-03-13 | Discharge: 2020-03-13 | Disposition: A | Discharge status: Home / Self Care | Payer: BLUE CROSS/BLUE SHIELD

## 2020-03-13 ENCOUNTER — Other Ambulatory Visit: Payer: BLUE CROSS/BLUE SHIELD

## 2020-03-13 DIAGNOSIS — Z20822 Contact with and (suspected) exposure to covid-19: Secondary | ICD-10-CM

## 2020-03-15 LAB — NOVEL CORONAVIRUS, NAA: SARS-CoV-2, NAA: NOT DETECTED

## 2020-03-15 LAB — SARS-COV-2, NAA 2 DAY TAT
